# Patient Record
Sex: Male | Born: 1950 | Race: White | Hispanic: No | Marital: Married | Smoking: Former smoker
Health system: Southern US, Community
[De-identification: ages and names within clinical notes are randomized; demographics above are authoritative.]

## PROBLEM LIST (undated history)

## (undated) DIAGNOSIS — Z8546 Personal history of malignant neoplasm of prostate: Secondary | ICD-10-CM

## (undated) DIAGNOSIS — I10 Essential (primary) hypertension: Secondary | ICD-10-CM

## (undated) DIAGNOSIS — IMO0002 Reserved for concepts with insufficient information to code with codable children: Secondary | ICD-10-CM

## (undated) DIAGNOSIS — IMO0001 Reserved for inherently not codable concepts without codable children: Secondary | ICD-10-CM

## (undated) HISTORY — DX: Reserved for inherently not codable concepts without codable children: IMO0001

## (undated) HISTORY — PX: ROTATOR CUFF REPAIR: SHX139

## (undated) HISTORY — PX: HERNIA REPAIR: SHX51

## (undated) HISTORY — PX: PROSTATE SURGERY: SHX751

## (undated) HISTORY — DX: Essential (primary) hypertension: I10

## (undated) HISTORY — DX: Personal history of malignant neoplasm of prostate: Z85.46

## (undated) HISTORY — DX: Reserved for concepts with insufficient information to code with codable children: IMO0002

---
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
## 1978-08-09 HISTORY — PX: KNEE SURGERY: SHX244

## 2000-03-01 ENCOUNTER — Encounter: Payer: Self-pay | Admitting: Orthopaedic Surgery

## 2000-03-01 ENCOUNTER — Encounter: Admission: RE | Admit: 2000-03-01 | Discharge: 2000-03-01 | Payer: Self-pay | Admitting: Orthopaedic Surgery

## 2001-02-23 ENCOUNTER — Encounter: Payer: Self-pay | Admitting: Orthopaedic Surgery

## 2001-02-23 ENCOUNTER — Encounter: Admission: RE | Admit: 2001-02-23 | Discharge: 2001-02-23 | Payer: Self-pay | Admitting: Orthopaedic Surgery

## 2001-04-06 ENCOUNTER — Ambulatory Visit (HOSPITAL_BASED_OUTPATIENT_CLINIC_OR_DEPARTMENT_OTHER): Admission: RE | Admit: 2001-04-06 | Discharge: 2001-04-06 | Payer: Self-pay | Admitting: Orthopaedic Surgery

## 2001-11-24 ENCOUNTER — Emergency Department (HOSPITAL_COMMUNITY): Admission: EM | Admit: 2001-11-24 | Discharge: 2001-11-24 | Payer: Self-pay | Admitting: Emergency Medicine

## 2001-11-24 ENCOUNTER — Encounter: Payer: Self-pay | Admitting: Emergency Medicine

## 2005-07-15 ENCOUNTER — Ambulatory Visit (HOSPITAL_COMMUNITY): Admission: RE | Admit: 2005-07-15 | Discharge: 2005-07-15 | Payer: Self-pay | Admitting: Internal Medicine

## 2006-05-31 ENCOUNTER — Encounter: Admission: RE | Admit: 2006-05-31 | Discharge: 2006-05-31 | Payer: Self-pay | Admitting: Orthopaedic Surgery

## 2006-07-14 ENCOUNTER — Ambulatory Visit (HOSPITAL_BASED_OUTPATIENT_CLINIC_OR_DEPARTMENT_OTHER): Admission: RE | Admit: 2006-07-14 | Discharge: 2006-07-14 | Payer: Self-pay | Admitting: Orthopaedic Surgery

## 2009-08-09 DIAGNOSIS — Z8546 Personal history of malignant neoplasm of prostate: Secondary | ICD-10-CM

## 2009-08-09 HISTORY — DX: Personal history of malignant neoplasm of prostate: Z85.46

## 2010-01-02 ENCOUNTER — Ambulatory Visit (HOSPITAL_COMMUNITY): Admission: RE | Admit: 2010-01-02 | Discharge: 2010-01-02 | Payer: Self-pay | Admitting: Urology

## 2010-01-15 ENCOUNTER — Ambulatory Visit: Admission: RE | Admit: 2010-01-15 | Discharge: 2010-02-11 | Payer: Self-pay | Admitting: Radiation Oncology

## 2010-07-01 ENCOUNTER — Ambulatory Visit
Admission: RE | Admit: 2010-07-01 | Discharge: 2010-09-08 | Payer: Self-pay | Source: Home / Self Care | Attending: Radiation Oncology | Admitting: Radiation Oncology

## 2010-09-09 ENCOUNTER — Ambulatory Visit: Payer: 59 | Attending: Radiation Oncology | Admitting: Radiation Oncology

## 2010-09-09 DIAGNOSIS — Z510 Encounter for antineoplastic radiation therapy: Secondary | ICD-10-CM | POA: Insufficient documentation

## 2010-09-09 DIAGNOSIS — C61 Malignant neoplasm of prostate: Secondary | ICD-10-CM | POA: Insufficient documentation

## 2010-09-09 DIAGNOSIS — R300 Dysuria: Secondary | ICD-10-CM | POA: Insufficient documentation

## 2010-10-29 ENCOUNTER — Ambulatory Visit: Payer: 59 | Attending: Radiation Oncology | Admitting: Radiation Oncology

## 2010-12-25 NOTE — Op Note (Signed)
NAME:  Dakota [name], Dakota [name]                  ACCOUNT NO.:  [id number]   MEDICAL RECORD NO.:  [id number]          PATIENT TYPE:  AMB   LOCATION:  DSC                          FACILITY:  [facility]   PHYSICIAN:  Claude Manges. Whitfield, M.D.DATE OF BIRTH:  March 05, 1951   DATE OF PROCEDURE:  07/14/2006  DATE OF DISCHARGE:                               OPERATIVE REPORT   PREOPERATIVE DIAGNOSES:  1. Recurrent rotator cuff tear, right shoulder, with impingement.  2. Degenerative joint disease, acromioclavicular joint.   POSTOPERATIVE DIAGNOSES:  1. Recurrent rotator cuff tear, right shoulder, with impingement.  2. Degenerative joint disease, acromioclavicular joint.  3. Old tear of biceps tendon.   PROCEDURES:  1. Diagnostic arthroscopy, right shoulder, with extensive debridement      of synovitis.  2. Arthroscopic subacromial decompression.  3. Arthroscopic distal clavicle resection.  4. Mini open rotator cuff tear repair with a Restore patch      supplementation.   SURGEON:  Claude Manges. Cleophas Dunker, M.D.   ASSISTANT:  Arlys John D. Petrarca, P.A.-C.   ANESTHESIA:  General with interscalene nerve block.   COMPLICATIONS:  None.   HISTORY:  A 60 year old gentleman is 9 years status post successful  rotator cuff tear repair of his right shoulder.  He was doing well until  this past September when he was moving a step ladder and felt something  sharp happen in my right shoulder.  He has had an arthrogram of his  right shoulder as he has metal in his eye and revealing a recurrent  rotator cuff tear with retraction.  He is now to have an arthroscopic  evaluation with a rotator cuff tear repair if possible.   PROCEDURE:  With the patient comfortable on the operating table and  under general orotracheal anesthesia and with a supplemental  interscalene nerve block, he was placed in a semi-sitting position in a  shoulder frame.  The right shoulder was then prepped DuraPrep from the  base of neck circumferentially  to below the elbow.  Sterile draping was  performed.   A marking pen was used to outline the coracoid, the [facility] joint and the  acromion.  At a point a fingerbreadth posterior and medial to the  posterior angle of the acromion, a small stab wound was made and the  arthroscope easily placed into the shoulder joint.  Diagnostic  arthroscopy revealed an absent biceps tendon.  I did not see any  appreciable chondromalacia of the humeral head or the glenoid.  There  was some fraying of the glenoid labrum but otherwise it was intact.  There were diffuse areas of synovitis and an obvious retracted rotator  cuff tear involving infraspinatus and supraspinatus.  A second portal  was established anteriorly and debridement of the synovitis and frayed  anterior labrum was performed.   The arthroscope was placed in the subacromial space posteriorly, a  cannula in the subacromial space anteriorly, and a third portal  established in the lateral subacromial space.  An arthroscopic  subacromial decompression was performed.  There was some recurrent  overhang of the anterior acromion.  A  6 mm bur was used to perform a  bony acromioplasty.  There was also obvious degenerative change of the  [facility] joint.  Accordingly, a distal clavicle resection was performed with  the same 6 mm bur with a very nice resection.  There was considerable  bursal tissue that was resected.  The rotator cuff tear with retraction  was obvious with rounded edges.   A mini open rotator cuff tear repair was then performed.  A portion of  the old long incision extending from the [facility] joint distally was used and  via sharp dissection carried down through the subcutaneous tissue.  The  deltoid fascia was incised.  A Modeste-retaining retractor was inserted.  There was still considerable scar tissue and cicatrix as well as some  residual anterior inferior bursal tissue that I resected, at which point  then I could better visualize the subacromial  space.  The subscapularis  tendon was intact.  The biceps, as previously identified, was completely  torn.  The supraspinatus was torn in a very unusual fashion extending  medially and then directed obliquely toward the greater tuberosity and  then extending posteriorly at another oblique angle.  The edges were  retracted.  I took some time to try and release it posteriorly from its  scar so that I could advance it anteriorly.  As I did, I roughened the  anterior humeral head to obtain bleeding bone and inserted to [place]  anchors and then used further 0 Ethibond suture to suture the subscap to  the supraspinatus.  I had a very nice repair without any gaps.  I  elected to supplement the repair with a Restore SIS patch.  It was  reconstituted in saline for approximately 10 minutes and then sutured in  place with 2-0 Ethibond under tension.  The wound was then copiously  irrigated with saline solution, the deltoid fascia closed with running 0  Vicryl, the subcu with 2-0 Vicryl, skin closed with Steri-Strips over  Benzoin.  A sterile bulky dressing was applied, followed by a sling.   PLAN:  Mepergan Fortis for pain.  Office 1 week.      Claude Manges. Cleophas Dunker, M.D.  Electronically Signed     PWW/MEDQ  D:  07/14/2006  T:  07/15/2006  Job:  [id number]

## 2010-12-25 NOTE — Op Note (Signed)
[place]  Patient:    [name], [name] Visit Number: [id number] MRN: [id number]          Service Type: Attending:  Claude Manges. Cleophas Dunker, M.D. Proc. Date: 04/05/01                             Operative Report  PREOPERATIVE DIAGNOSES: 1. Impingement of left shoulder. 2. Degenerative joint disease, acromioclavicular joint.  POSTOPERATIVE DIAGNOSES: 1. Impingement of left shoulder. 2. Degenerative joint disease, acromioclavicular joint. 3. Partial tear of biceps tendon.  PROCEDURE: 1. Arthroscopic debridement of left shoulder joint including biceps tendon.    There was a very small partial tear of the rotator cuff that was also    resected. 2. Arthroscopic subacromial decompression. 3. Arthroscopic distal clavicle resection.  SURGEON:  Claude Manges. Cleophas Dunker, M.D.  ASSISTANT:  None.  ANESTHESIA:  General orotracheal.  COMPLICATIONS:  None.  INDICATIONS:  A 60 year old gentleman with approximately 2 to 2-1/2 month history of left shoulder pain.  He was working on a truck when he felt something "happen" to his left shoulder.  He has been sore and achy since that time associated with popping and clocking.  The pain can be quite severe to the point where it interferes with his activities.  He has been through a course of physical therapy, and has tried several anti-inflammatory medicines, but continues to have discomfort.  We did obtain a shoulder arthrogram, as he could not have an MRI scan based on small metallic fragment in his eye, and he did not have evidence of rotator cuff tear.  He was offered the opportunity of continued nonoperative care versus arthroscopic evaluation and prefers the latter.  He is to have that procedure today.  DESCRIPTION OF PROCEDURE:  With the patient comfortable on the operating table and under general orotracheal anesthesia, the patient was placed in the semi-sitting position using a shoulder frame.  The left  shoulder was then prepped from the base of the neck circumferentially below the elbow with DuraPrep and sterile draping was performed.  A marking pen _______ was used to outline the coracoid of the [facility] joint and acromion at a point a fingerbreadth inferior and medial to the posterior angle of the acromion, and a small stab wound was made prior to which point, 0.25% Marcaine with epinephrine was injected.  A small stab wound was made and arthroscopy was performed to the shoulder joint without difficulty.  The labrum both anteriorly and posteriorly was not torn.  It was just minimally frayed.  There was a partial tear of the biceps tendon about an inch from its insertion on the superior glenoid, and there was a very small partial rotator cuff tear.  A second portal was established anteriorly and a 4 mm scope inserted, and debridement of the partial rotator cuff and partial biceps tendon was performed.  The arthroscope was then placed in the subacromial joint posteriorly and the cannula inserted in the same space anteriorly.  A third portal was established in the lateral subacromial space.  The 4.0 Cuda shaver was inserted and soft tissue was debrided from the subacromial space.  The ArthroCare wand was inserted to further debride and a 6 mm hooded bur was used to perform the formal decompression.  It was obvious the anterior and lateral spurring of the acromion, and this was carefully debrided, and I had very nice debridement such that there was no further impingement.  There was no evidence of a bursal surface rotator cuff.  There was considerable degenerative change of the [facility] joint with obvious impingement and spurring.  The distal clavicle was resected using the 6 mm bur and I had a very nice resection.  The space was clear without evidence of bleeding.  The three stab wounds were infiltrated with 0.25% Marcaine without epinephrine.  The anterior and the lateral stab wound was closed  with interrupted 4-0 Ethilon.  A sterile bulky dressing was applied followed by a sling.  PLAN:  Mepergan Fortis for pain and office in one week. Attending:  Claude Manges. Cleophas Dunker, M.D. DD:  04/06/01 TD:  04/06/01 Job: [id number]

## 2011-05-13 ENCOUNTER — Encounter (INDEPENDENT_AMBULATORY_CARE_PROVIDER_SITE_OTHER): Payer: Self-pay | Admitting: General Surgery

## 2011-05-13 ENCOUNTER — Ambulatory Visit (INDEPENDENT_AMBULATORY_CARE_PROVIDER_SITE_OTHER): Payer: 59 | Admitting: General Surgery

## 2011-05-13 VITALS — BP 142/88 | HR 76 | Temp 97.8°F | Resp 12 | Ht 68.0 in | Wt 181.8 lb

## 2011-05-13 DIAGNOSIS — K4091 Unilateral inguinal hernia, without obstruction or gangrene, recurrent: Secondary | ICD-10-CM

## 2011-05-13 NOTE — Progress Notes (Signed)
Subjective:   Right inguinal hernia  Patient ID: Dakota [name], male   DOB: June 05, 1951, 60 y.o.   MRN: [id number]  HPI Patient returns to the office for reevaluation for his right inguinal hernia. I saw him in early 2011 but subsequently he was found to have prostate cancer and has undergone a radical prostatectomy and radiation therapy. He has done well following this. His right inguinal hernia has gotten gradually larger and more symptomatic. He recently had a CT scan done by his urologist in followup for his prostate cancer. This confirmed a right inguinal hernia containing small intestine and also described a small left inguinal hernia containing only fat. He has no significant symptoms in the left groin.  Review of Systems  Constitutional: Negative.   Respiratory: Negative.   Cardiovascular: Negative.   Gastrointestinal:       Recent neg colonoscopy  Genitourinary: Positive for urgency.  Musculoskeletal: Positive for arthralgias.       Objective:   Physical Exam Gen.: Healthy-appearing in no distress Skin: Warm and dry without rash or infection Lungs: Clear without wheezing report for her breathing Cardiovascular: Regular rate and rhythm without murmur Abdomen: Generally soft and nontender. There is a moderate-sized right inguinal hernia reducible. I cannot feel any evidence of hernia in the left groin. Extremities: No joint swelling or edema Neurologic: Alert and fully oriented. Gait normal.    Assessment:     Symptomatic right inguinal hernia. CT indicates a small hernia in the left groin but I think this is insignificant. Since he has had prostatectomy and radiation we will need to repair his hernia with an anterior open approach. We discussed the nature of the surgery including risks of bleeding, infection, recurrence and chronic pain. We will schedule this as convenience.    Plan:     Open repair of right inguinal hernia as an outpatient under general anesthesia

## 2011-05-13 NOTE — Patient Instructions (Signed)
Inguinal Hernia, Adult Muscles help keep everything in the body in its proper place. But if a weak spot in the muscles develops, something can poke through. That is called a hernia. When this happens in the lower part of the belly (abdomen), it is called an inguinal hernia. (It takes its name from a part of the body in this region called the inguinal canal.) A weak spot in the wall of muscles lets some fat or part of the small intestine bulge through. An inguinal hernia can develop at any age. Men get them more often than women. CAUSES In adults, an inguinal hernia develops over time.  It can be triggered by:   Suddenly straining the muscles of the lower abdomen.   Lifting heavy objects.   Straining to have a bowel movement. Difficult bowel movements (constipation) can lead to this.   Constant coughing. This may be caused by smoking or lung disease.   Being overweight.   Being pregnant.   Working at a job that requires long periods of standing or heavy lifting.   Having had an inguinal hernia before.  One type can be an emergency situation. It is called a strangulated inguinal hernia. It develops if part of the small intestine slips through the weak spot and cannot get back into the abdomen. The blood supply can be cut off. If that happens, part of the intestine may die. This situation requires emergency surgery. SYMPTOMS Often, a small inguinal hernia has no symptoms. It is found when a healthcare provider does a physical exam. Larger hernias usually have symptoms.   In adults, symptoms may include:   A lump in the groin. This is easier to see when the person is standing. It might disappear when lying down.   In men, a lump in the scrotum.   Pain or burning in the groin. This occurs especially when lifting, straining or coughing.   A dull ache or feeling of pressure in the groin.   Signs of a strangulated hernia can include:   A bulge in the groin that becomes very painful and  tender to the touch.   A bulge that turns red or purple.   Fever, nausea and vomiting.   Inability to have a bowel movement or to pass gas.  DIAGNOSIS To decide if you have an inguinal hernia, a healthcare provider will probably do a physical examination.  This will include asking questions about any symptoms you have noticed.   The healthcare provider might feel the groin area and ask you to cough. If an inguinal hernia is felt, the healthcare provider may try to slide it back into the abdomen.   Usually no other tests are needed.  TREATMENT Treatments can vary. The size of the hernia makes a difference. Options include:  Watchful waiting. This is often suggested if the hernia is small and you have had no symptoms.   No medical procedure will be done unless symptoms develop.   You will need to watch closely for symptoms. If any occur, contact your healthcare provider right away.   Surgery. This is used if the hernia is larger or you have symptoms.   Open surgery. This is usually an outpatient procedure (you will not stay overnight in a hospital). An cut (incision) is made through the skin in the groin. The hernia is put back inside the abdomen. The weak area in the muscles is then repaired by:  --Herniorrhaphy. In this type of surgery, the weak muscles are sewn   back together. --Hernioplasty. A patch or mesh is used to close the weak area in the abdominal wall.   Laparoscopy. In this procedure, a surgeon makes small incisions. A thin tube with a tiny video camera (called a laparoscope) is put into the abdomen. The surgeon repairs the hernia with mesh by looking with the video camera and using two long instruments.  HOME CARE INSTRUCTIONS  After surgery to repair an inguinal hernia:   You will need to take pain medicine prescribed by your healthcare provider. Follow all directions carefully.   You will need to take care of the wound from the incision.   Your activity will be  restricted for awhile. This will probably include no heavy lifting for several weeks. You also should not do anything too active for a few weeks. When you can return to work will depend on the type of job that you have.   During "watchful waiting" periods, you should:   Maintain a healthy weight.   Eat a diet high in fiber (fruits, vegetables and whole grains).   Drink plenty of fluids to avoid constipation. This means drinking enough water and other liquids to keep your urine clear or pale yellow.   Do not lift heavy objects.   Do not stand for long periods of time.   Quit smoking. This should keep you from developing a frequent cough.  SEEK MEDICAL CARE IF:  A bulge develops in your groin area.   You feel pain, a burning sensation or pressure in the groin. This might be worse if you are lifting or straining.   You develop a fever of more than 100.5F (38.1 C).  SEEK IMMEDIATE MEDICAL CARE IF:  Pain in the groin increases suddenly.   A bulge in the groin gets bigger suddenly and does not go down.   For men, there is sudden pain in the scrotum. Or, the size of the scrotum increases.   A bulge in the groin area becomes red or purple and is painful to touch.   You have nausea or vomiting that does not go away.   You feel your heart beating much faster than normal.   You cannot have a bowel movement or pass gas.   You develop a fever of more than 102.0F (38.9C).  Document Released: 12/12/2008 Document Re-Released: 01/13/2010 Exit[employer] Patient Information 2011 [employer]. 

## 2011-05-24 DIAGNOSIS — K4090 Unilateral inguinal hernia, without obstruction or gangrene, not specified as recurrent: Secondary | ICD-10-CM

## 2011-05-24 HISTORY — PX: HERNIA REPAIR: SHX51

## 2011-06-04 ENCOUNTER — Other Ambulatory Visit: Payer: Self-pay | Admitting: Orthopaedic Surgery

## 2011-06-04 DIAGNOSIS — M25512 Pain in left shoulder: Secondary | ICD-10-CM

## 2011-06-08 ENCOUNTER — Ambulatory Visit
Admission: RE | Admit: 2011-06-08 | Discharge: 2011-06-08 | Disposition: A | Discharge status: Home / Self Care | Payer: 59 | Source: Ambulatory Visit | Attending: Orthopaedic Surgery | Admitting: Orthopaedic Surgery

## 2011-06-08 DIAGNOSIS — M25512 Pain in left shoulder: Secondary | ICD-10-CM

## 2011-06-08 MED ORDER — IOHEXOL 180 MG/ML  SOLN
10.0000 mL | Freq: Once | INTRAMUSCULAR | Status: AC | PRN
  Administered 2011-06-08: 10 mL via INTRA_ARTICULAR

## 2011-06-11 ENCOUNTER — Ambulatory Visit (INDEPENDENT_AMBULATORY_CARE_PROVIDER_SITE_OTHER): Payer: 59 | Admitting: General Surgery

## 2011-06-11 ENCOUNTER — Encounter (INDEPENDENT_AMBULATORY_CARE_PROVIDER_SITE_OTHER): Payer: Self-pay | Admitting: General Surgery

## 2011-06-11 VITALS — BP 116/82 | HR 80 | Temp 97.2°F | Resp 20 | Ht 68.0 in | Wt 183.0 lb

## 2011-06-11 DIAGNOSIS — Z09 Encounter for follow-up examination after completed treatment for conditions other than malignant neoplasm: Secondary | ICD-10-CM

## 2011-06-11 NOTE — Progress Notes (Signed)
Patient returns approximately 3 weeks following open repair of his right inguinal hernia. He reports appropriate initial discomfort and now soreness that is improving. He is getting back to normal activities. He is very satisfied at how he is done.  On examination his wound is healing nicely without infection or other complication. There is some expected mild induration or which I told him would resolve over the next few weeks.  Patient is doing well following his hernia repair. We discussed a schedule of returning to full activity. He is doing well enough that we will see him back on a p.r.n. basis.

## 2011-06-11 NOTE — Patient Instructions (Signed)
In one week you may begin any routine activity and gradually resume heavy lifting over a two-week period

## 2011-07-08 ENCOUNTER — Encounter (INDEPENDENT_AMBULATORY_CARE_PROVIDER_SITE_OTHER): Payer: Self-pay

## 2011-07-26 ENCOUNTER — Ambulatory Visit: Payer: 59 | Attending: Orthopaedic Surgery | Admitting: Physical Therapy

## 2011-07-26 DIAGNOSIS — M6281 Muscle weakness (generalized): Secondary | ICD-10-CM | POA: Insufficient documentation

## 2011-07-26 DIAGNOSIS — IMO0001 Reserved for inherently not codable concepts without codable children: Secondary | ICD-10-CM | POA: Insufficient documentation

## 2011-07-26 DIAGNOSIS — M25519 Pain in unspecified shoulder: Secondary | ICD-10-CM | POA: Insufficient documentation

## 2011-07-26 DIAGNOSIS — M25619 Stiffness of unspecified shoulder, not elsewhere classified: Secondary | ICD-10-CM | POA: Insufficient documentation

## 2011-08-04 ENCOUNTER — Ambulatory Visit: Payer: 59 | Admitting: Physical Therapy

## 2011-08-11 ENCOUNTER — Ambulatory Visit: Payer: BC Managed Care – PPO | Admitting: Physical Therapy

## 2011-08-13 ENCOUNTER — Ambulatory Visit: Payer: BC Managed Care – PPO | Admitting: Physical Therapy

## 2011-09-15 ENCOUNTER — Ambulatory Visit: Payer: BC Managed Care – PPO | Attending: Orthopaedic Surgery | Admitting: Physical Therapy

## 2011-09-15 DIAGNOSIS — M25619 Stiffness of unspecified shoulder, not elsewhere classified: Secondary | ICD-10-CM | POA: Insufficient documentation

## 2011-09-15 DIAGNOSIS — M25519 Pain in unspecified shoulder: Secondary | ICD-10-CM | POA: Insufficient documentation

## 2011-09-15 DIAGNOSIS — IMO0001 Reserved for inherently not codable concepts without codable children: Secondary | ICD-10-CM | POA: Insufficient documentation

## 2011-09-15 DIAGNOSIS — M6281 Muscle weakness (generalized): Secondary | ICD-10-CM | POA: Insufficient documentation

## 2011-10-06 ENCOUNTER — Encounter: Payer: BC Managed Care – PPO | Admitting: Physical Therapy

## 2012-08-03 ENCOUNTER — Ambulatory Visit: Payer: BC Managed Care – PPO | Admitting: Internal Medicine

## 2013-04-26 ENCOUNTER — Emergency Department (HOSPITAL_BASED_OUTPATIENT_CLINIC_OR_DEPARTMENT_OTHER): Payer: BC Managed Care – PPO

## 2013-04-26 ENCOUNTER — Encounter (HOSPITAL_BASED_OUTPATIENT_CLINIC_OR_DEPARTMENT_OTHER): Payer: Self-pay

## 2013-04-26 ENCOUNTER — Emergency Department (HOSPITAL_BASED_OUTPATIENT_CLINIC_OR_DEPARTMENT_OTHER)
Admission: EM | Admit: 2013-04-26 | Discharge: 2013-04-27 | Disposition: A | Discharge status: Home / Self Care | Payer: BC Managed Care – PPO | Attending: Emergency Medicine | Admitting: Emergency Medicine

## 2013-04-26 DIAGNOSIS — M109 Gout, unspecified: Secondary | ICD-10-CM | POA: Insufficient documentation

## 2013-04-26 DIAGNOSIS — I10 Essential (primary) hypertension: Secondary | ICD-10-CM | POA: Insufficient documentation

## 2013-04-26 DIAGNOSIS — Z923 Personal history of irradiation: Secondary | ICD-10-CM | POA: Insufficient documentation

## 2013-04-26 DIAGNOSIS — W1809XA Striking against other object with subsequent fall, initial encounter: Secondary | ICD-10-CM | POA: Insufficient documentation

## 2013-04-26 DIAGNOSIS — Z79899 Other long term (current) drug therapy: Secondary | ICD-10-CM | POA: Insufficient documentation

## 2013-04-26 DIAGNOSIS — S61412A Laceration without foreign body of left hand, initial encounter: Secondary | ICD-10-CM

## 2013-04-26 DIAGNOSIS — Z8546 Personal history of malignant neoplasm of prostate: Secondary | ICD-10-CM | POA: Insufficient documentation

## 2013-04-26 DIAGNOSIS — S61209A Unspecified open wound of unspecified finger without damage to nail, initial encounter: Secondary | ICD-10-CM | POA: Insufficient documentation

## 2013-04-26 DIAGNOSIS — Z87891 Personal history of nicotine dependence: Secondary | ICD-10-CM | POA: Insufficient documentation

## 2013-04-26 DIAGNOSIS — Y92009 Unspecified place in unspecified non-institutional (private) residence as the place of occurrence of the external cause: Secondary | ICD-10-CM | POA: Insufficient documentation

## 2013-04-26 DIAGNOSIS — W010XXA Fall on same level from slipping, tripping and stumbling without subsequent striking against object, initial encounter: Secondary | ICD-10-CM | POA: Insufficient documentation

## 2013-04-26 DIAGNOSIS — Z7982 Long term (current) use of aspirin: Secondary | ICD-10-CM | POA: Insufficient documentation

## 2013-04-26 DIAGNOSIS — Y9389 Activity, other specified: Secondary | ICD-10-CM | POA: Insufficient documentation

## 2013-04-26 MED ORDER — SODIUM CHLORIDE 0.9 % IV SOLN: Freq: Once | INTRAVENOUS | Status: DC

## 2013-04-26 MED ORDER — CEPHALEXIN 250 MG PO CAPS: 250.0000 mg | ORAL_CAPSULE | Freq: Four times a day (QID) | ORAL | Status: AC

## 2013-04-26 MED ORDER — LIDOCAINE HCL 2 % IJ SOLN
INTRAMUSCULAR | Status: AC
  Filled 2013-04-26: qty 20

## 2013-04-26 MED ORDER — CEFAZOLIN SODIUM 1-5 GM-% IV SOLN
1.0000 g | Freq: Once | INTRAVENOUS | Status: AC
  Administered 2013-04-26: 1 g via INTRAVENOUS
  Filled 2013-04-26: qty 50

## 2013-04-26 NOTE — ED Provider Notes (Signed)
CSN: [id number]     Arrival date & time 04/26/13  2058 History  This chart was scribed for Hanley Seamen, MD by Greggory Stallion, ED Scribe. This patient was seen in room MH10/MH10 and the patient's care was started at 11:08 PM.   Chief Complaint  Patient presents with  . Hand Injury   The history is provided by the patient. No language interpreter was used.    HPI Comments: Dakota [name] is a 62 y.o. male who presents to the Emergency Department complaining of left ring finger laceration that occurred around 7:30 PM tonight while he was working his is garden. He states he slipped and fell and hit the top of a steel pipe. Pt states he cleaned out the wound at home with just water. He states he believes his last tetanus was about 2 years ago.    Past Medical History  Diagnosis Date  . History of prostate cancer 2011  . Radiation   . Hypertension   . Gout    Past Surgical History  Procedure Laterality Date  . Rotator cuff repair      2x right and 1x on left  . Knee surgery  1980    right  . Testicle removal  1970  . Prostate surgery  2011radical prostatectomy and radiation  . Hernia repair      BIH as infant  . Hernia repair  05/24/11    RIH    Family History  Problem Relation Age of Onset  . Diabetes Mother   . Heart disease Mother   . Cancer Father     renal   History  Substance Use Topics  . Smoking status: Former Games developer  . Smokeless tobacco: Never Used  . Alcohol Use: 0.0 oz/week    Review of Systems  All other systems reviewed and are negative.  A complete 10 system review of systems was obtained and all systems are negative except as noted in the HPI and PMH.   Allergies  Doxycycline and Tylox  Home Medications   Current Outpatient Rx  Name  Route  Sig  Dispense  Refill  . allopurinol (ZYLOPRIM) 300 MG tablet   Oral   Take 300 mg by mouth daily.           Marland Kitchen aspirin 81 MG tablet   Oral   Take 81 mg by mouth daily.           Marland Kitchen atenolol (TENORMIN)  25 MG tablet   Oral   Take 25 mg by mouth daily.           . Multiple Vitamin (MULTIVITAMIN) capsule   Oral   Take 1 capsule by mouth daily.           Marland Kitchen VITAMIN E PO   Oral   Take by mouth daily.            BP 147/110  Pulse 85  Temp(Src) 98 F (36.7 C) (Other (Comment))  Resp 20  Ht 5\' 8"  (1.727 m)  Wt 190 lb (86.183 kg)  BMI 28.9 kg/m2  SpO2 98%  Physical Exam  Nursing note and vitals reviewed. General: Well-developed, well-nourished male in no acute distress; appearance consistent with age of record HENT: normocephalic; atraumatic Eyes: pupils equal, round and reactive to light; extraocular muscles intact Neck: supple Heart: regular rate and rhythm; no murmurs, rubs or gallops. Distal capillary refill is brisk.  Lungs: clear to auscultation bilaterally Abdomen: soft; nondistended; nontender; no masses or hepatosplenomegaly;  bowel sounds present Extremities: No deformity; full range of motion; pulses normal. Laceration on vulvar aspect of the MCP joint of the left ring finger. Visualized exposed tendon but the sheath appears intact. No other extremity wound noted. Neurologic: Awake, alert and oriented; motor function intact in all extremities and symmetric; no facial droop. Sensation intact in finger. Tendon function intact in finger.  Skin: Warm and dry Psychiatric: Normal mood and affect  ED Course  LACERATION REPAIR Date/Time: 04/26/2013 11:36 PM Performed by: Elson Areas Authorized by: Elson Areas Consent: Verbal consent obtained. Risks and benefits: risks, benefits and alternatives were discussed Consent given by: patient Patient understanding: patient states understanding of the procedure being performed Patient identity confirmed: verbally with patient Time out: Immediately prior to procedure a "time out" was called to verify the correct patient, procedure, equipment, support staff and site/side marked as required. Body area: upper  extremity Location details: left ring finger Laceration length: 2 cm Foreign bodies: no foreign bodies Tendon involvement: none Nerve involvement: none Vascular damage: no Anesthesia: local infiltration Patient sedated: no Preparation: Patient was prepped and draped in the usual sterile fashion. Irrigation solution: saline Irrigation method: syringe Amount of cleaning: extensive Debridement: none Degree of undermining: none Skin closure: 5-0 Prolene Number of sutures: 7 Technique: simple Approximation: loose Approximation difficulty: simple Patient tolerance: Patient tolerated the procedure well with no immediate complications. Comments: Tendon exposed,  No laceration to tendon,  Explored with Dr. Read Drivers,   From  Normal sensation good cap refill  Medical screening examination/treatment/procedure(s) were conducted as a shared visit with non-physician practitioner(s) and myself.  I personally evaluated the patient during the encounter, the non-physician practitioner's involvement was limited to wound closure.  DIAGNOSTIC STUDIES: Oxygen Saturation is 98% on RA, normal by my interpretation.    COORDINATION OF CARE: 11:13 PM-Discussed treatment plan which includes laceration repair and antibiotic with pt at bedside and pt agreed to plan. Advised pt to follow up with a hand surgeon.   Labs Review Labs Reviewed - No data to display Imaging Review Dg Finger Ring Left  04/26/2013   CLINICAL DATA:  Laceration to the left ring finger.  EXAM: LEFT RING FINGER 2+V  COMPARISON:  No priors.  FINDINGS: Three views of the left 4th finger demonstrate no acute displaced fracture, subluxation or dislocation. Large soft tissue abnormality along the volar aspect of the 4th proximal phalanx compatible with the reported laceration. No retained radiopaque foreign bodies in the visualized soft tissues.  IMPRESSION: No acute bony abnormality of the left finger. No retained radiopaque foreign bodies in the  soft tissues.   Electronically Signed   By: Trudie Reed M.D.   On: 04/26/2013 21:37    MDM           Hanley Seamen, MD 04/26/13 [phone]

## 2013-04-26 NOTE — ED Notes (Signed)
MD at bedside. 

## 2013-04-26 NOTE — ED Notes (Signed)
Fell in garden approx 730pm-cut left 4th finger on pipe-lac noted with bleeding controlled

## 2013-06-28 ENCOUNTER — Ambulatory Visit
Admission: RE | Admit: 2013-06-28 | Discharge: 2013-06-28 | Disposition: A | Discharge status: Home / Self Care | Payer: BC Managed Care – PPO | Source: Ambulatory Visit | Attending: Internal Medicine | Admitting: Internal Medicine

## 2013-06-28 ENCOUNTER — Other Ambulatory Visit: Payer: Self-pay | Admitting: Internal Medicine

## 2013-06-28 DIAGNOSIS — R05 Cough: Secondary | ICD-10-CM

## 2013-09-17 ENCOUNTER — Other Ambulatory Visit (HOSPITAL_COMMUNITY): Payer: Self-pay | Admitting: Urology

## 2013-09-17 DIAGNOSIS — C61 Malignant neoplasm of prostate: Secondary | ICD-10-CM

## 2013-09-21 ENCOUNTER — Encounter (HOSPITAL_COMMUNITY)
Admission: RE | Admit: 2013-09-21 | Discharge: 2013-09-21 | Disposition: A | Discharge status: Home / Self Care | Payer: Commercial Managed Care - PPO | Source: Ambulatory Visit | Attending: Urology | Admitting: Urology

## 2013-09-21 DIAGNOSIS — C61 Malignant neoplasm of prostate: Secondary | ICD-10-CM

## 2013-09-21 MED ORDER — TECHNETIUM TC 99M MEDRONATE IV KIT
25.0000 | PACK | Freq: Once | INTRAVENOUS | Status: AC | PRN
  Administered 2013-09-21: 27 via INTRAVENOUS

## 2015-05-07 IMAGING — CR DG FINGER RING 2+V*L*
3 series · 3 of 3 positions shown · non-contrast
Comparison: No priors.

CLINICAL DATA: Laceration to the left ring finger.

EXAM:
LEFT RING FINGER 2+V

[x finger pa left]
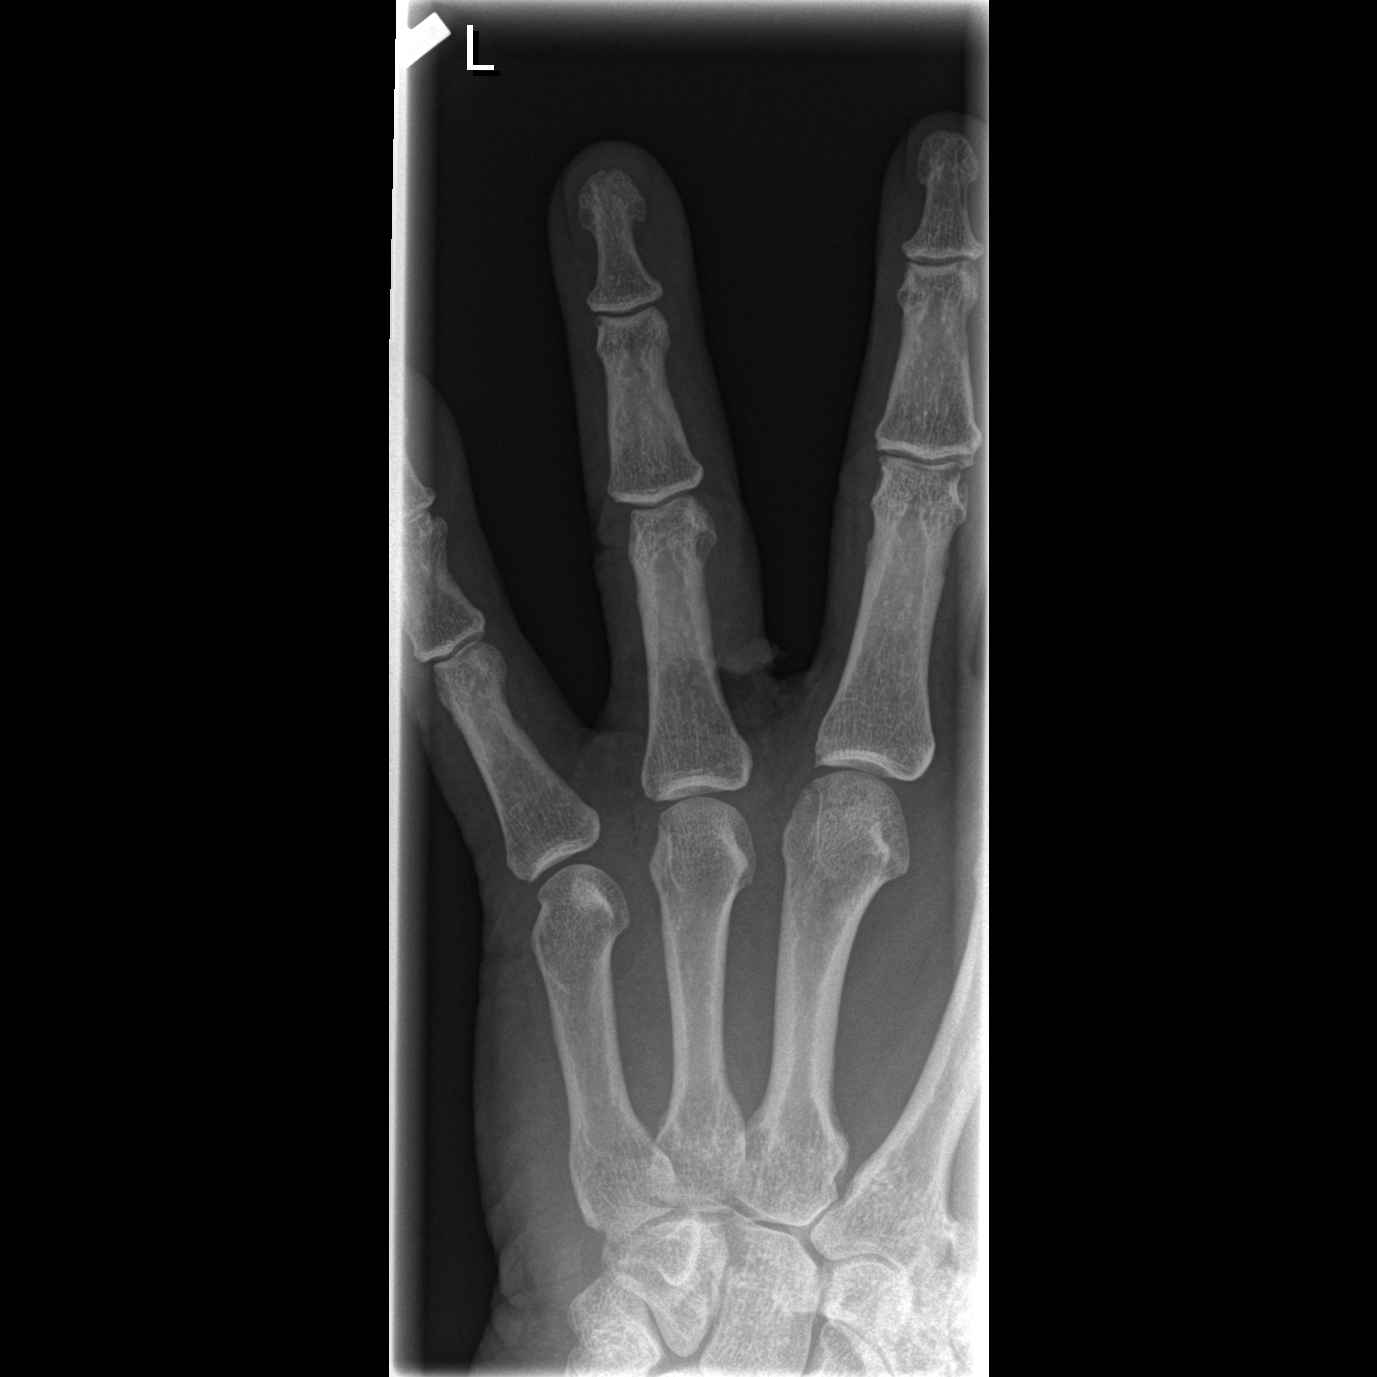

[x finger obl. left]
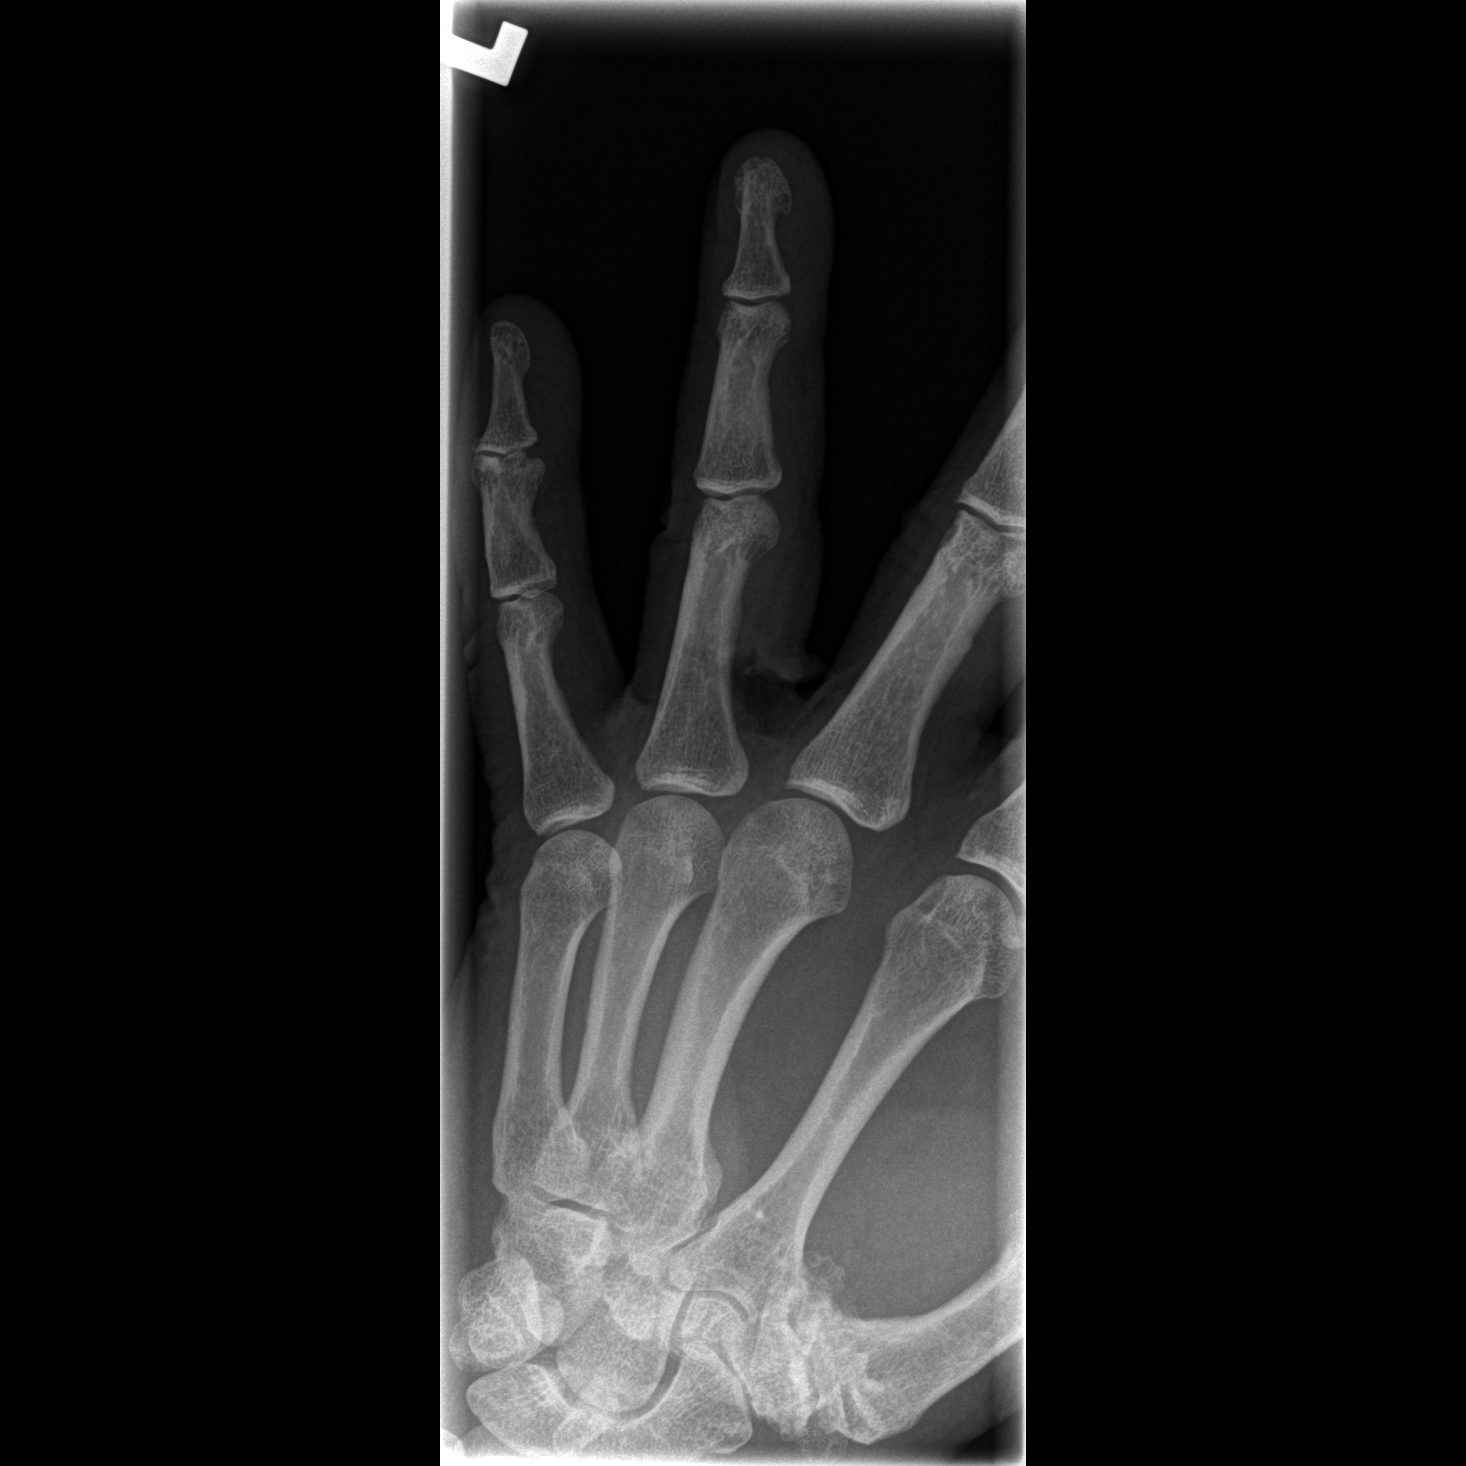

[x finger lateral left]
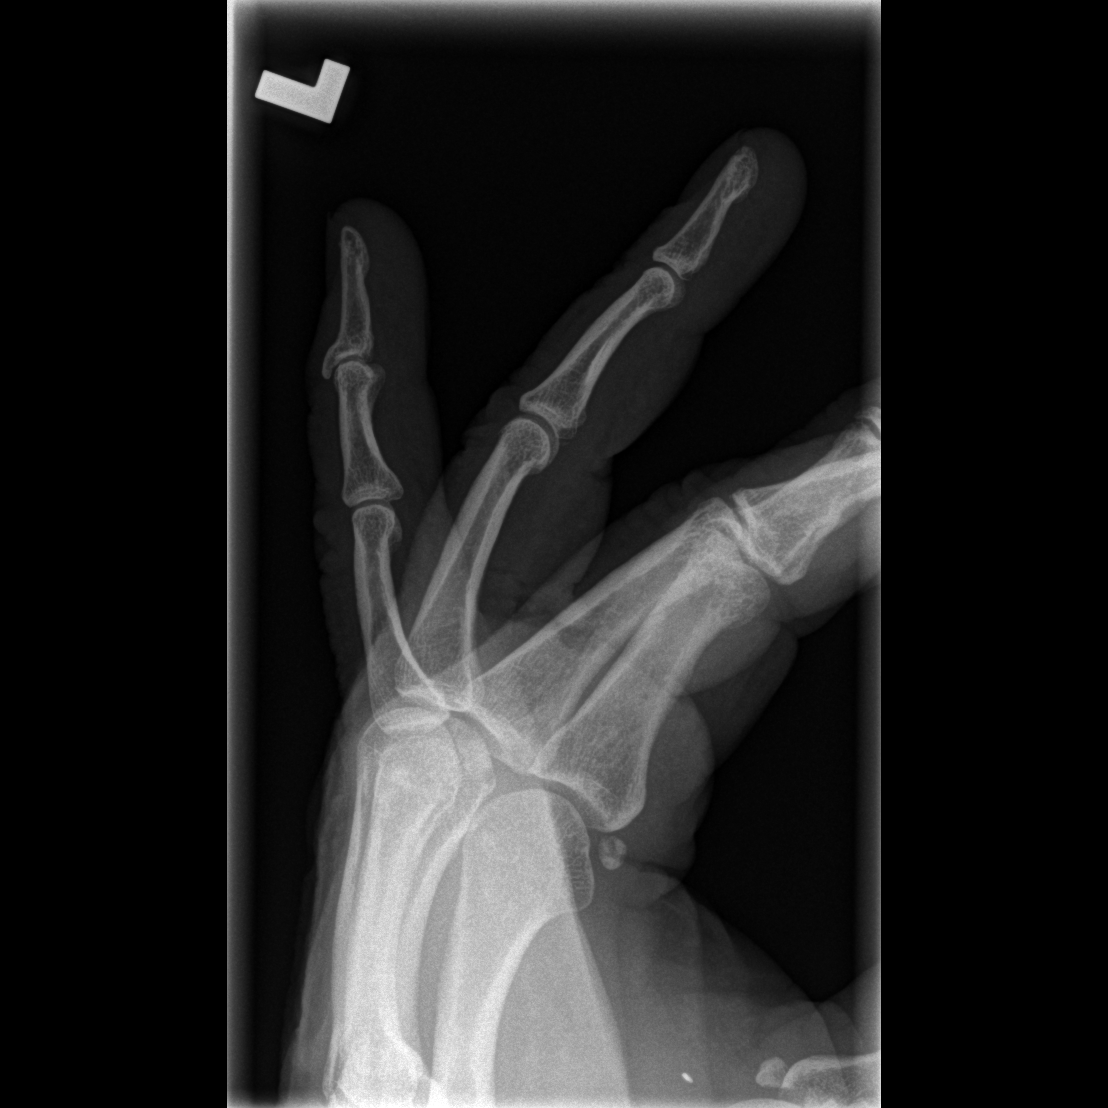

[3 of 3 positions shown; findings below may reference images not displayed]

FINDINGS: Three views of the left 4th finger demonstrate no acute displaced
fracture, subluxation or dislocation. Large soft tissue abnormality
along the volar aspect of the 4th proximal phalanx compatible with
the reported laceration. No retained radiopaque foreign bodies in
the visualized soft tissues.
IMPRESSION: No acute bony abnormality of the left finger. No retained radiopaque
foreign bodies in the soft tissues.

## 2015-09-10 DEATH — deceased

## 2021-05-20 ENCOUNTER — Telehealth: Payer: Self-pay

## 2021-05-20 NOTE — Telephone Encounter (Signed)
Error
